# Patient Record
Sex: Female | Born: 1965 | Race: White | Hispanic: No | Marital: Married | Smoking: Former smoker
Health system: Southern US, Community
[De-identification: ages and names within clinical notes are randomized; demographics above are authoritative.]

## PROBLEM LIST (undated history)

## (undated) DIAGNOSIS — I10 Essential (primary) hypertension: Secondary | ICD-10-CM

---

## 1998-11-25 ENCOUNTER — Other Ambulatory Visit: Admission: RE | Admit: 1998-11-25 | Discharge: 1998-11-25 | Payer: Self-pay | Admitting: Obstetrics & Gynecology

## 1999-03-14 ENCOUNTER — Ambulatory Visit (HOSPITAL_COMMUNITY): Admission: RE | Admit: 1999-03-14 | Discharge: 1999-03-14 | Payer: Self-pay | Admitting: Obstetrics & Gynecology

## 2000-04-22 ENCOUNTER — Other Ambulatory Visit: Admission: RE | Admit: 2000-04-22 | Discharge: 2000-04-22 | Payer: Self-pay | Admitting: Obstetrics & Gynecology

## 2001-04-28 ENCOUNTER — Other Ambulatory Visit: Admission: RE | Admit: 2001-04-28 | Discharge: 2001-04-28 | Payer: Self-pay | Admitting: Obstetrics & Gynecology

## 2003-11-05 ENCOUNTER — Other Ambulatory Visit: Admission: RE | Admit: 2003-11-05 | Discharge: 2003-11-05 | Payer: Self-pay | Admitting: Obstetrics & Gynecology

## 2004-12-25 ENCOUNTER — Other Ambulatory Visit: Admission: RE | Admit: 2004-12-25 | Discharge: 2004-12-25 | Payer: Self-pay | Admitting: Gynecology

## 2012-09-14 DIAGNOSIS — R928 Other abnormal and inconclusive findings on diagnostic imaging of breast: Secondary | ICD-10-CM | POA: Insufficient documentation

## 2012-12-09 ENCOUNTER — Other Ambulatory Visit: Payer: Self-pay | Admitting: Obstetrics & Gynecology

## 2012-12-09 DIAGNOSIS — R928 Other abnormal and inconclusive findings on diagnostic imaging of breast: Secondary | ICD-10-CM

## 2012-12-23 ENCOUNTER — Ambulatory Visit
Admission: RE | Admit: 2012-12-23 | Discharge: 2012-12-23 | Disposition: A | Discharge status: Home / Self Care | Payer: Managed Care, Other (non HMO) | Source: Ambulatory Visit | Attending: Obstetrics & Gynecology | Admitting: Obstetrics & Gynecology

## 2012-12-23 DIAGNOSIS — R928 Other abnormal and inconclusive findings on diagnostic imaging of breast: Secondary | ICD-10-CM

## 2013-11-27 ENCOUNTER — Other Ambulatory Visit: Payer: Self-pay | Admitting: Obstetrics & Gynecology

## 2013-11-27 DIAGNOSIS — N6310 Unspecified lump in the right breast, unspecified quadrant: Secondary | ICD-10-CM

## 2013-12-07 ENCOUNTER — Ambulatory Visit
Admission: RE | Admit: 2013-12-07 | Discharge: 2013-12-07 | Disposition: A | Discharge status: Home / Self Care | Payer: BC Managed Care – PPO | Source: Ambulatory Visit | Attending: Obstetrics & Gynecology | Admitting: Obstetrics & Gynecology

## 2013-12-07 DIAGNOSIS — N6310 Unspecified lump in the right breast, unspecified quadrant: Secondary | ICD-10-CM

## 2014-12-19 ENCOUNTER — Other Ambulatory Visit: Payer: Self-pay | Admitting: Obstetrics & Gynecology

## 2014-12-19 DIAGNOSIS — N63 Unspecified lump in unspecified breast: Secondary | ICD-10-CM

## 2014-12-25 ENCOUNTER — Ambulatory Visit
Admission: RE | Admit: 2014-12-25 | Discharge: 2014-12-25 | Disposition: A | Discharge status: Home / Self Care | Payer: BLUE CROSS/BLUE SHIELD | Source: Ambulatory Visit | Attending: Obstetrics & Gynecology | Admitting: Obstetrics & Gynecology

## 2014-12-25 ENCOUNTER — Other Ambulatory Visit: Payer: Self-pay | Admitting: Obstetrics & Gynecology

## 2014-12-25 DIAGNOSIS — N63 Unspecified lump in unspecified breast: Secondary | ICD-10-CM

## 2015-05-13 IMAGING — MG MM DIAG BREAST TOMO BILATERAL
8 series · 8 of 24 positions shown · non-contrast
Comparison: Priors

CLINICAL DATA: Patient for short-term followup probably benign
right breast mass.

EXAM:
DIGITAL DIAGNOSTIC BILATERAL MAMMOGRAM WITH 3D TOMOSYNTHESIS WITH
CAD
ULTRASOUND BILATERAL BREAST

[R CC]
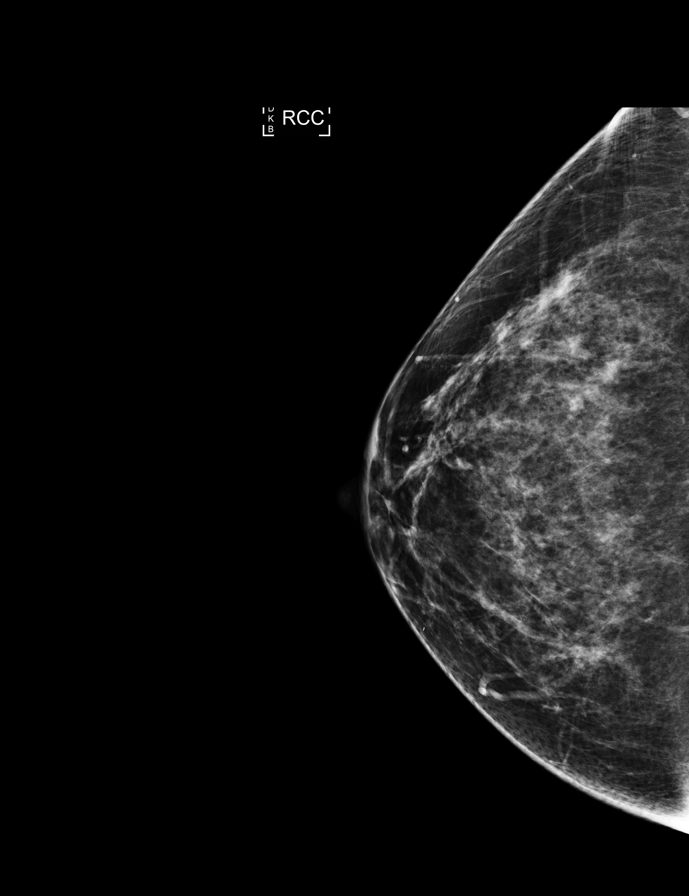

[L CC]
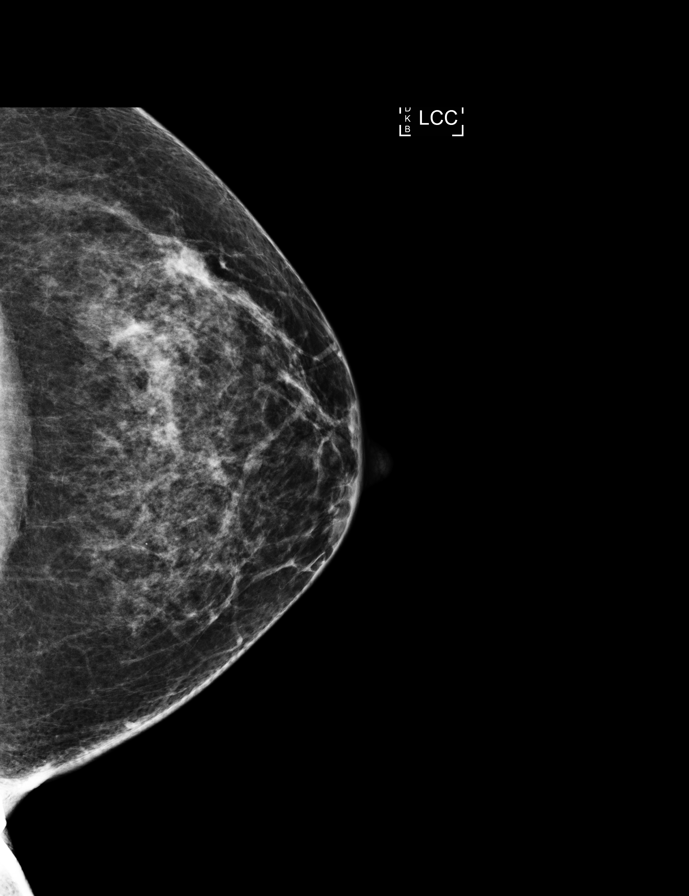

[R MLO]
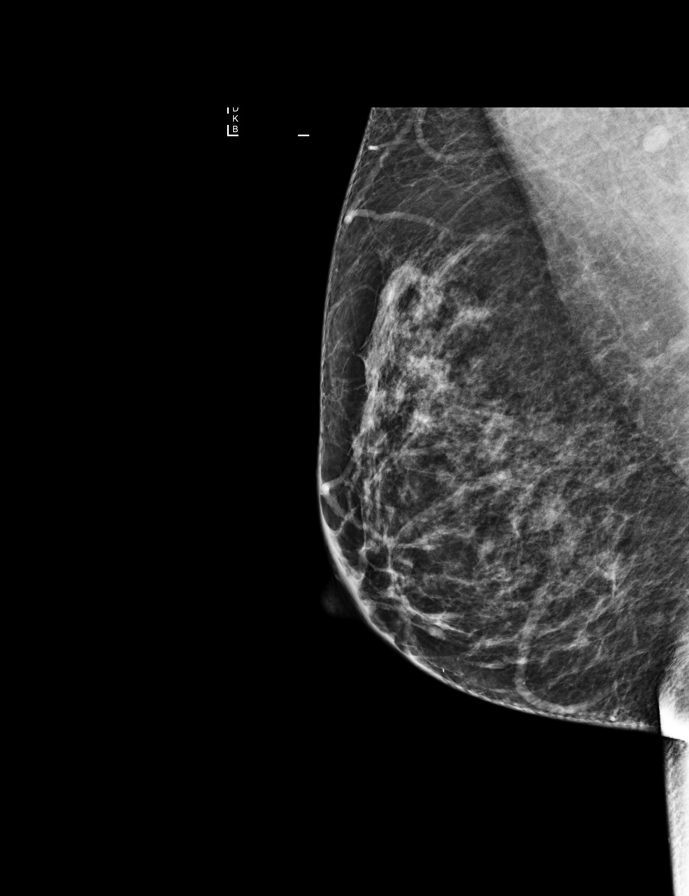

[L MLO]
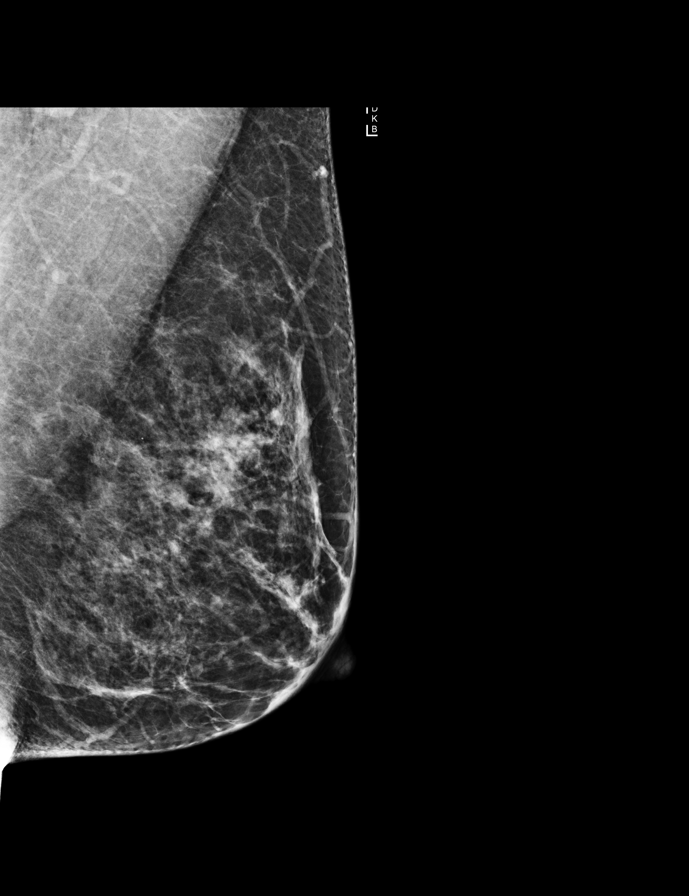

[R MLO tomo · tomo slice 36/71.0]
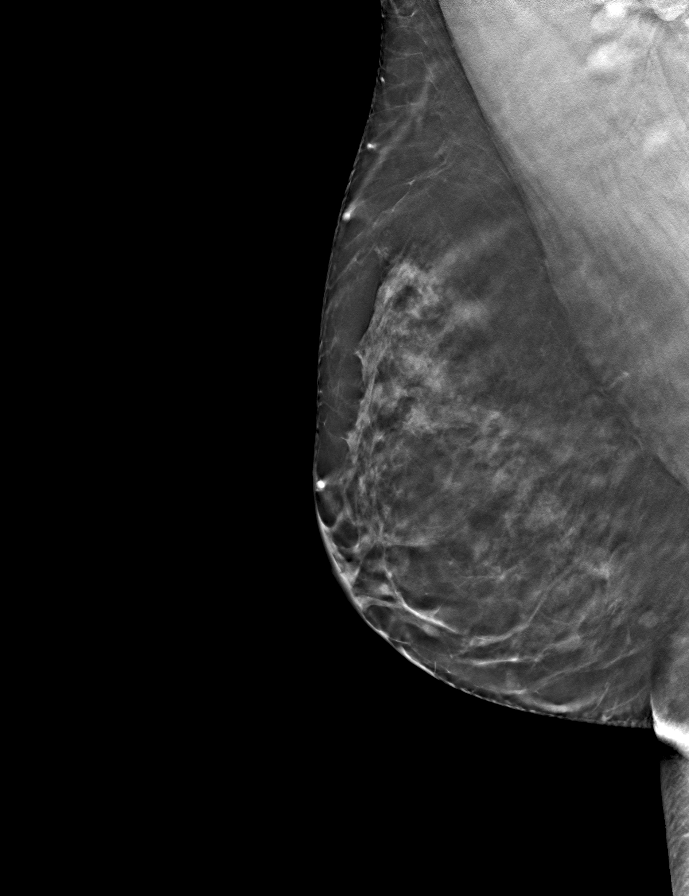

[L MLO tomo · tomo slice 35/70.0]
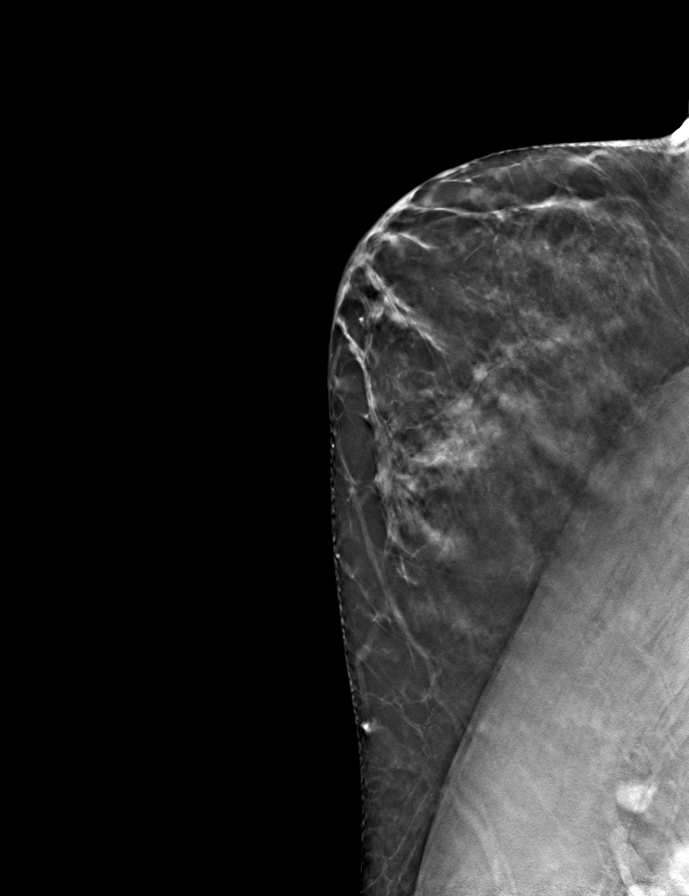

[L CC tomo · tomo slice 33/65.0]
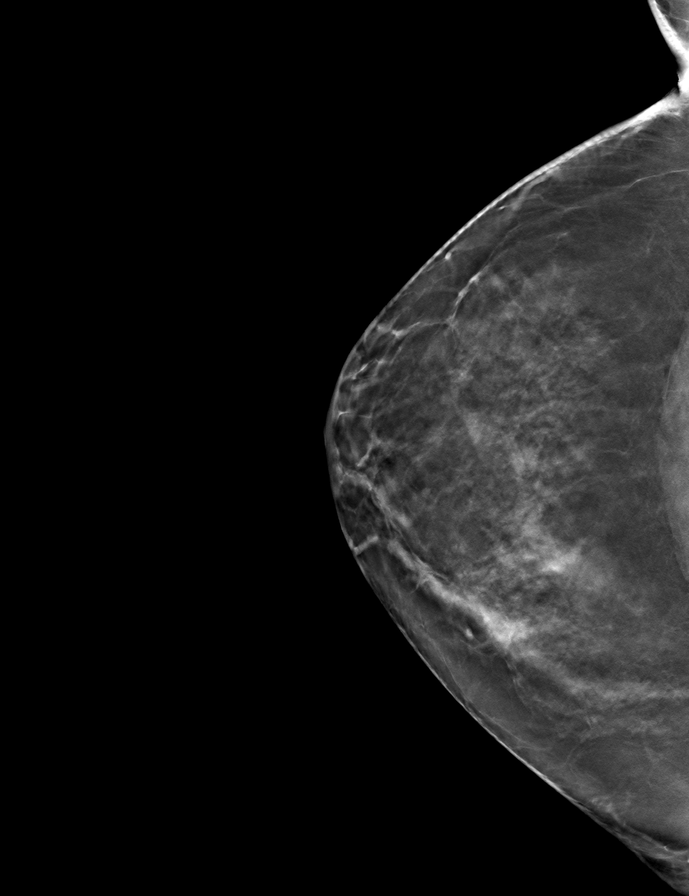

[R CC tomo · tomo slice 35/68.0]
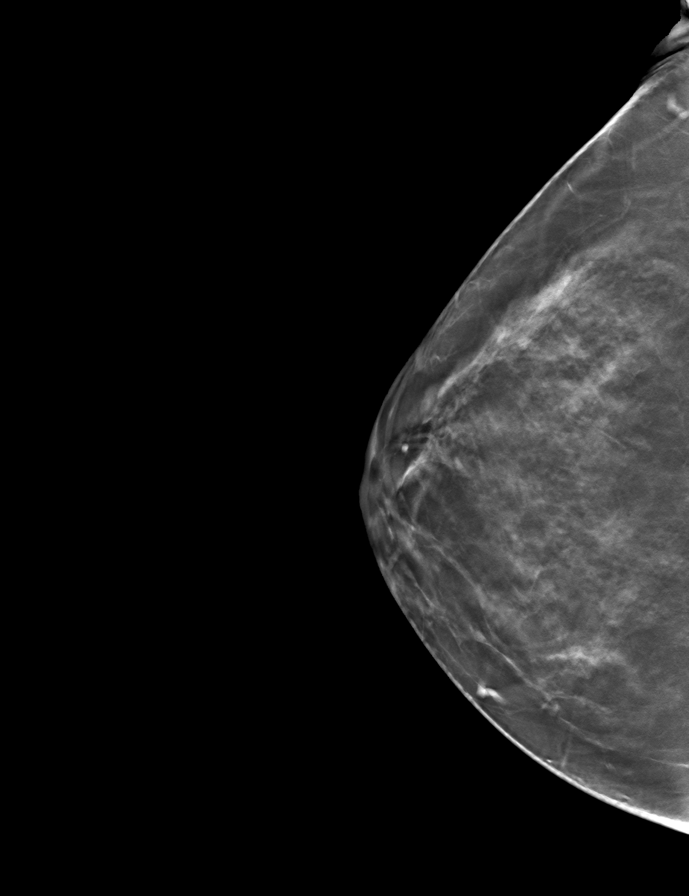

[8 of 24 positions shown; findings below may reference images not displayed]

ACR Breast Density Category c: The breast tissue is heterogeneously
dense, which may obscure small masses.
FINDINGS: Questioned mass within the upper-outer right breast appears to have
resolved on mammography. Within the inferior lateral and inferior
medial there are 2 new oval circumscribed masses measuring 4 mm
within the lower outer left breast and 5 mm within the lower inner
left breast.

Mammographic images were processed with CAD.

On physical exam, I palpate no discrete mass within the lower outer
left breast or lower inner left breast.

Targeted ultrasound is performed, showing a 3 x 3 x 4 mm probable
complicated cyst within the left breast 6 o'clock position 1 cm from
the nipple. Additionally within the left breast 4 o'clock position 3
cm from the nipple there is a 5 x 3 x 5 mm probable complicated
cyst.
IMPRESSION: New oval circumscribed masses within the left breast favored to
represent complicated cysts on ultrasound.

Interval resolution right breast mass, most compatible with resolved
cyst.

RECOMMENDATION:
Left breast diagnostic mammography and ultrasound in 6 months to
demonstrate stability of probably benign findings.

I have discussed the findings and recommendations with the patient.
Results were also provided in writing at the conclusion of the
visit. If applicable, a reminder letter will be sent to the patient
regarding the next appointment.

BI-RADS CATEGORY  3: Probably benign.

## 2015-09-12 DIAGNOSIS — E039 Hypothyroidism, unspecified: Secondary | ICD-10-CM | POA: Insufficient documentation

## 2015-09-12 DIAGNOSIS — F419 Anxiety disorder, unspecified: Secondary | ICD-10-CM | POA: Insufficient documentation

## 2016-03-10 ENCOUNTER — Other Ambulatory Visit: Payer: Self-pay | Admitting: Obstetrics & Gynecology

## 2016-03-10 DIAGNOSIS — N6320 Unspecified lump in the left breast, unspecified quadrant: Secondary | ICD-10-CM

## 2016-03-16 ENCOUNTER — Ambulatory Visit
Admission: RE | Admit: 2016-03-16 | Discharge: 2016-03-16 | Disposition: A | Discharge status: Home / Self Care | Payer: Managed Care, Other (non HMO) | Source: Ambulatory Visit | Attending: Obstetrics & Gynecology | Admitting: Obstetrics & Gynecology

## 2016-03-16 DIAGNOSIS — N6320 Unspecified lump in the left breast, unspecified quadrant: Secondary | ICD-10-CM

## 2016-12-07 DIAGNOSIS — M7070 Other bursitis of hip, unspecified hip: Secondary | ICD-10-CM | POA: Insufficient documentation

## 2017-10-29 DIAGNOSIS — M2240 Chondromalacia patellae, unspecified knee: Secondary | ICD-10-CM | POA: Insufficient documentation

## 2017-10-29 DIAGNOSIS — N3281 Overactive bladder: Secondary | ICD-10-CM | POA: Insufficient documentation

## 2017-10-29 DIAGNOSIS — N912 Amenorrhea, unspecified: Secondary | ICD-10-CM | POA: Insufficient documentation

## 2017-10-29 DIAGNOSIS — IMO0002 Reserved for concepts with insufficient information to code with codable children: Secondary | ICD-10-CM | POA: Insufficient documentation

## 2017-10-29 DIAGNOSIS — N926 Irregular menstruation, unspecified: Secondary | ICD-10-CM | POA: Insufficient documentation

## 2019-02-22 DIAGNOSIS — M5412 Radiculopathy, cervical region: Secondary | ICD-10-CM | POA: Insufficient documentation

## 2019-02-22 DIAGNOSIS — G5600 Carpal tunnel syndrome, unspecified upper limb: Secondary | ICD-10-CM | POA: Insufficient documentation

## 2019-02-27 DIAGNOSIS — F988 Other specified behavioral and emotional disorders with onset usually occurring in childhood and adolescence: Secondary | ICD-10-CM | POA: Insufficient documentation

## 2019-02-27 DIAGNOSIS — M25559 Pain in unspecified hip: Secondary | ICD-10-CM | POA: Insufficient documentation

## 2019-02-27 DIAGNOSIS — I10 Essential (primary) hypertension: Secondary | ICD-10-CM | POA: Insufficient documentation

## 2019-06-26 DIAGNOSIS — M5090 Cervical disc disorder, unspecified, unspecified cervical region: Secondary | ICD-10-CM | POA: Insufficient documentation

## 2019-11-20 ENCOUNTER — Other Ambulatory Visit: Payer: Self-pay | Admitting: Family Medicine

## 2019-11-20 DIAGNOSIS — R200 Anesthesia of skin: Secondary | ICD-10-CM

## 2019-11-20 DIAGNOSIS — M50122 Cervical disc disorder at C5-C6 level with radiculopathy: Secondary | ICD-10-CM

## 2019-12-06 ENCOUNTER — Other Ambulatory Visit: Payer: Managed Care, Other (non HMO)

## 2020-12-17 ENCOUNTER — Ambulatory Visit (INDEPENDENT_AMBULATORY_CARE_PROVIDER_SITE_OTHER): Payer: PRIVATE HEALTH INSURANCE | Admitting: Podiatry

## 2020-12-17 ENCOUNTER — Other Ambulatory Visit: Payer: Self-pay

## 2020-12-17 ENCOUNTER — Encounter: Payer: Self-pay | Admitting: Podiatry

## 2020-12-17 DIAGNOSIS — B351 Tinea unguium: Secondary | ICD-10-CM

## 2020-12-17 DIAGNOSIS — Z79899 Other long term (current) drug therapy: Secondary | ICD-10-CM

## 2020-12-17 NOTE — Progress Notes (Signed)
Subjective:  Patient ID: Sandra Wall, female    DOB: [birth year],  MRN: [id number]  Chief Complaint  Patient presents with  . Nail Problem    Patient presents today for nail fungus, thick curving nails x 1-2 months.      55 y.o. female presents with the above complaint.  Patient presents with complaint of thickened elongated dystrophic mycotic toenails x4 to bilateral first and second digit.  Patient states that she has not tried any over-the-counter options.  She states that she has tried Vicks VapoRub some fungal meds but none of which has helped.  She would like to get it evaluated and would like to discuss treatment for fungus.  She denies any other acute complaints she has not seen anyone else prior to seeing me.   Review of Systems: Negative except as noted in the HPI. Denies N/V/F/Ch.  No past medical history on file.  Current Outpatient Medications:  .  ALPRAZolam (XANAX) 0.25 MG tablet, alprazolam 0.25 mg tablet  TAKE 1 TABLET BY MOUTH THREE TIMES DAILY AS NEEDED FOR ANXIETY, Disp: , Rfl:  .  amphetamine-dextroamphetamine (ADDERALL) 20 MG tablet, Take 1 tablet by mouth daily., Disp: , Rfl:  .  atorvastatin (LIPITOR) 20 MG tablet, Take 1 tablet by mouth daily., Disp: , Rfl:  .  levothyroxine (SYNTHROID) 75 MCG tablet, levothyroxine 75 mcg tablet  TAKE 1 TABLET BY MOUTH ONCE DAILY IN THE MORNING AT 6AM, Disp: , Rfl:  .  meloxicam (MOBIC) 15 MG tablet, meloxicam 15 mg tablet, Disp: , Rfl:  .  perindopril (ACEON) 4 MG tablet, perindopril erbumine 4 mg tablet  TAKE 1 TABLET BY MOUTH ONCE DAILY, Disp: , Rfl:  .  [START ON 02/08/2021] amphetamine-dextroamphetamine (ADDERALL) 30 MG tablet, Take 1 tablet by mouth every 8 (eight) hours as needed., Disp: , Rfl:  .  escitalopram (LEXAPRO) 20 MG tablet, Take 1 tablet by mouth daily., Disp: , Rfl:  .  fluticasone (FLONASE) 50 MCG/ACT nasal spray, fluticasone propionate 50 mcg/actuation nasal spray,suspension, Disp: , Rfl:   Social History    Tobacco Use  Smoking Status Former Smoker  Smokeless Tobacco Never Used    Allergies  Allergen Reactions  . Morphine Rash   Objective:  There were no vitals filed for this visit. There is no height or weight on file to calculate BMI. Constitutional Well developed. Well nourished.  Vascular Dorsalis pedis pulses palpable bilaterally. Posterior tibial pulses palpable bilaterally. Capillary refill normal to all digits.  No cyanosis or clubbing noted. Pedal hair growth normal.  Neurologic Normal speech. Oriented to person, place, and time. Epicritic sensation to light touch grossly present bilaterally.  Dermatologic Nails thickened elongated dystrophic toenails x4.  This is to bilateral first and second digit.  No pain on palpation Skin within normal limits bilateral  Orthopedic: Normal joint ROM without pain or crepitus bilaterally. No visible deformities. No bony tenderness.   Radiographs: None Assessment:   1. Encounter for long-term current use of medication    Plan:  Patient was evaluated and treated and all questions answered.  Bilateral hallux and second digit onychomycosis/nail fungus -Educated the patient on the etiology of onychomycosis and various treatment options associated with improving the fungal load.  I explained to the patient that there is 3 treatment options available to treat the onychomycosis including topical, p.o., laser treatment.  Patient elected to undergo p.o. options with Lamisil/terbinafine therapy.  In order for me to start the medication therapy, I explained to the patient the importance  of evaluating the liver and obtaining the liver function test.  Once the liver function test comes back normal I will start him on 10-month course of Lamisil therapy.  Patient understood all risk and would like to proceed with Lamisil therapy.  I have asked the patient to immediately stop the Lamisil therapy if she has any reactions to it and call the office or go  to the emergency room right away.  Patient states understanding   No follow-ups on file.

## 2020-12-18 ENCOUNTER — Other Ambulatory Visit: Payer: Self-pay | Admitting: Podiatry

## 2020-12-18 LAB — HEPATIC FUNCTION PANEL
ALT: 15 IU/L (ref 0–32)
AST: 12 IU/L (ref 0–40)
Albumin: 4.7 g/dL (ref 3.8–4.9)
Alkaline Phosphatase: 99 IU/L (ref 44–121)
Bilirubin Total: 0.2 mg/dL (ref 0.0–1.2)
Bilirubin, Direct: <0.1 mg/dL (ref 0.00–0.40)
Total Protein: 7 g/dL (ref 6.0–8.5)

## 2020-12-18 MED ORDER — TERBINAFINE HCL 250 MG PO TABS: 250.0000 mg | ORAL_TABLET | Freq: Every day | ORAL | 0 refills | Status: AC

## 2021-03-20 ENCOUNTER — Other Ambulatory Visit: Payer: Self-pay

## 2021-03-20 ENCOUNTER — Encounter: Payer: Self-pay | Admitting: Podiatry

## 2021-03-20 ENCOUNTER — Ambulatory Visit (INDEPENDENT_AMBULATORY_CARE_PROVIDER_SITE_OTHER): Payer: PRIVATE HEALTH INSURANCE | Admitting: Podiatry

## 2021-03-20 DIAGNOSIS — Z79899 Other long term (current) drug therapy: Secondary | ICD-10-CM

## 2021-03-20 DIAGNOSIS — B351 Tinea unguium: Secondary | ICD-10-CM | POA: Diagnosis not present

## 2021-03-20 NOTE — Progress Notes (Signed)
  Subjective:  Patient ID: Sandra Wall, female    DOB: [birth year],  MRN: [id number]  Chief Complaint  Patient presents with   Nail Problem    Nail fungus follow up     55 y.o. female presents with the above complaint.  Patient presents with follow-up of thickened elongated dystrophic toenails x4.  Patient states she is doing a lot better the Lamisil therapy helped considerably.  She would like to discuss future treatment options.  She does not want to do another round if not needed..   Review of Systems: Negative except as noted in the HPI. Denies N/V/F/Ch.  No past medical history on file.  Current Outpatient Medications:    ALPRAZolam (XANAX) 0.25 MG tablet, alprazolam 0.25 mg tablet  TAKE 1 TABLET BY MOUTH THREE TIMES DAILY AS NEEDED FOR ANXIETY, Disp: , Rfl:    amphetamine-dextroamphetamine (ADDERALL) 20 MG tablet, Take 1 tablet by mouth daily., Disp: , Rfl:    amphetamine-dextroamphetamine (ADDERALL) 30 MG tablet, Take 1 tablet by mouth every 8 (eight) hours as needed., Disp: , Rfl:    atorvastatin (LIPITOR) 20 MG tablet, Take 1 tablet by mouth daily., Disp: , Rfl:    escitalopram (LEXAPRO) 20 MG tablet, Take 1 tablet by mouth daily., Disp: , Rfl:    fluticasone (FLONASE) 50 MCG/ACT nasal spray, fluticasone propionate 50 mcg/actuation nasal spray,suspension, Disp: , Rfl:    levothyroxine (SYNTHROID) 75 MCG tablet, levothyroxine 75 mcg tablet  TAKE 1 TABLET BY MOUTH ONCE DAILY IN THE MORNING AT 6AM, Disp: , Rfl:    meloxicam (MOBIC) 15 MG tablet, meloxicam 15 mg tablet, Disp: , Rfl:    perindopril (ACEON) 4 MG tablet, perindopril erbumine 4 mg tablet  TAKE 1 TABLET BY MOUTH ONCE DAILY, Disp: , Rfl:    terbinafine (LAMISIL) 250 MG tablet, Take 1 tablet (250 mg total) by mouth daily., Disp: 90 tablet, Rfl: 0  Social History   Tobacco Use  Smoking Status Former   Pack years: 0.00  Smokeless Tobacco Never    Allergies  Allergen Reactions   Morphine Rash   Objective:  There  were no vitals filed for this visit. There is no height or weight on file to calculate BMI. Constitutional Well developed. Well nourished.  Vascular Dorsalis pedis pulses palpable bilaterally. Posterior tibial pulses palpable bilaterally. Capillary refill normal to all digits.  No cyanosis or clubbing noted. Pedal hair growth normal.  Neurologic Normal speech. Oriented to person, place, and time. Epicritic sensation to light touch grossly present bilaterally.  Dermatologic Nails thickened elongated dystrophic toenails x4 improving.  This is to bilateral first and second digit improving.  No pain on palpation Skin within normal limits bilateral  Orthopedic: Normal joint ROM without pain or crepitus bilaterally. No visible deformities. No bony tenderness.   Radiographs: None Assessment:   1. Encounter for long-term current use of medication   2. Onychomycosis due to dermatophyte   3. Nail fungus     Plan:  Patient was evaluated and treated and all questions answered.  Bilateral hallux and second digit onychomycosis/nail fungus -Clinically healed with 1 round of Lamisil therapy.  At this time we will continue to clinically monitor if there is any regression I discussed with possible doing second round of Lamisil therapy.  Patient states understanding.  I discussed shoe gear modification and prevention techniques for fungus.  No acute complaints   No follow-ups on file.

## 2021-09-16 ENCOUNTER — Encounter: Payer: PRIVATE HEALTH INSURANCE | Admitting: Podiatry

## 2022-02-21 ENCOUNTER — Emergency Department
Admission: EM | Admit: 2022-02-21 | Discharge: 2022-02-21 | Disposition: A | Discharge status: Home / Self Care | Payer: Managed Care, Other (non HMO) | Attending: Emergency Medicine | Admitting: Emergency Medicine

## 2022-02-21 ENCOUNTER — Emergency Department: Payer: Managed Care, Other (non HMO)

## 2022-02-21 ENCOUNTER — Encounter: Payer: Self-pay | Admitting: Emergency Medicine

## 2022-02-21 ENCOUNTER — Other Ambulatory Visit: Payer: Self-pay

## 2022-02-21 DIAGNOSIS — S6992XA Unspecified injury of left wrist, hand and finger(s), initial encounter: Secondary | ICD-10-CM | POA: Diagnosis present

## 2022-02-21 DIAGNOSIS — W268XXA Contact with other sharp object(s), not elsewhere classified, initial encounter: Secondary | ICD-10-CM | POA: Insufficient documentation

## 2022-02-21 DIAGNOSIS — S61319A Laceration without foreign body of unspecified finger with damage to nail, initial encounter: Secondary | ICD-10-CM

## 2022-02-21 DIAGNOSIS — S61313A Laceration without foreign body of left middle finger with damage to nail, initial encounter: Secondary | ICD-10-CM | POA: Diagnosis not present

## 2022-02-21 DIAGNOSIS — S61213A Laceration without foreign body of left middle finger without damage to nail, initial encounter: Secondary | ICD-10-CM

## 2022-02-21 HISTORY — DX: Essential (primary) hypertension: I10

## 2022-02-21 MED ORDER — BUPIVACAINE HCL (PF) 0.5 % IJ SOLN
10.0000 mL | Freq: Once | INTRAMUSCULAR | Status: AC
  Administered 2022-02-21: 10 mL
  Filled 2022-02-21: qty 10

## 2022-02-21 MED ORDER — PENTAFLUOROPROP-TETRAFLUOROETH EX AERO
INHALATION_SPRAY | CUTANEOUS | Status: DC | PRN
  Filled 2022-02-21: qty 30

## 2022-02-21 MED ORDER — LIDOCAINE HCL (PF) 1 % IJ SOLN
5.0000 mL | Freq: Once | INTRAMUSCULAR | Status: AC
  Administered 2022-02-21: 5 mL
  Filled 2022-02-21 (×2): qty 5

## 2022-02-21 NOTE — ED Provider Notes (Signed)
[facility] Emergency Department Provider Note     Event Date/Time   First MD Initiated Contact with Patient 02/21/22 1502     (approximate)   History   Laceration   HPI  Sandra Wall is a 56 y.o. female presents to the ED for evaluation of accidental left hand middle finger laceration.  Patient apparently rotary blade.  She presents with a distal fingertip near avulsion.  She reports a current tetanus status.     Physical Exam   Triage Vital Signs: ED Triage Vitals  Enc Vitals Group     BP 02/21/22 1345 103/70     Pulse Rate 02/21/22 1345 84     Resp 02/21/22 1345 18     Temp 02/21/22 1345 98.9 F (37.2 C)     Temp Source 02/21/22 1345 Oral     SpO2 02/21/22 1345 95 %     Weight 02/21/22 1339 190 lb (86.2 kg)     Height 02/21/22 1339 5\' 5"  (1.651 m)     Head Circumference --      Peak Flow --      Pain Score 02/21/22 1332 2     Pain Loc --      Pain Edu? --      Excl. in GC? --     Most recent vital signs: Vitals:   02/21/22 1345  BP: 103/70  Pulse: 84  Resp: 18  Temp: 98.9 F (37.2 C)  SpO2: 95%    General Awake, no distress.  CV:  Good peripheral perfusion.  RESP:  Normal effort.  ABD:  No distention.  MSK:  Left middle finger with a distal nailbed laceration with extension around the radial aspect of the distal fingertip resulting in a partial nail tip avulsion.  Normal gross sensation is intact.  ED Results / Procedures / Treatments   Labs (all labs ordered are listed, but only abnormal results are displayed) Labs Reviewed - No data to display   EKG    RADIOLOGY  I personally viewed and evaluated these images as part of my medical decision making, as well as reviewing the written report by the radiologist.  ED Provider Interpretation: no acute bony injury}  DG Finger Middle Left  Result Date: 02/21/2022 CLINICAL DATA:  Laceration. EXAM: LEFT MIDDLE FINGER 2+V COMPARISON:  None Available. FINDINGS: No  evidence for an acute fracture. No subluxation or dislocation. No retained radiopaque soft tissue foreign body. IMPRESSION: No acute bony abnormality. Electronically Signed   By: 04/23/2022 M.D.   On: 02/21/2022 14:04     PROCEDURES:  Critical Care performed: No  ..Laceration Repair  Date/Time: 02/21/2022 4:27 PM  Performed by: 04/23/2022, PA-C Authorized by: Lissa Hoard, PA-C   Consent:    Consent obtained:  Verbal   Consent given by:  Patient   Risks, benefits, and alternatives were discussed: yes     Risks discussed:  Poor wound healing Universal protocol:    Imaging studies available: yes     Patient identity confirmed:  Verbally with patient Anesthesia:    Anesthesia method:  Nerve block   Block location:  Flexor tendon   Block needle gauge:  27 G   Block anesthetic:  Lidocaine 1% w/o epi and bupivacaine 0.5% w/o epi   Block technique:  Transthecal block   Block injection procedure:  Anatomic landmarks palpated, introduced needle and incremental injection   Block outcome:  Anesthesia achieved Laceration details:  Location:  Finger   Finger location:  L long finger   Length (cm):  1   Depth (mm):  5 Pre-procedure details:    Preparation:  Patient was prepped and draped in usual sterile fashion Exploration:    Limited defect created (wound extended): no     Hemostasis achieved with:  Direct pressure   Contaminated: no   Treatment:    Area cleansed with:  Saline and povidone-iodine   Amount of cleaning:  Standard   Irrigation solution:  Sterile saline   Irrigation volume:  10   Irrigation method:  Syringe   Debridement:  None   Undermining:  None   Scar revision: no     Layers repaired: nail bed: #2 5-0 vicryl interrupted. Skin repair:    Repair method:  Sutures   Suture size:  4-0   Suture material:  Nylon   Suture technique:  Simple interrupted   Number of sutures:  3 Approximation:    Approximation:  Close Repair type:     Repair type:  Simple Post-procedure details:    Dressing:  Splint for protection and non-adherent dressing   Procedure completion:  Tolerated well, no immediate complications    MEDICATIONS ORDERED IN ED: Medications  pentafluoroprop-tetrafluoroeth (GEBAUERS) aerosol (has no administration in time range)  lidocaine (PF) (XYLOCAINE) 1 % injection 5 mL (5 mLs Infiltration Given by Other 02/21/22 1530)  bupivacaine(PF) (MARCAINE) 0.5 % injection 10 mL (10 mLs Infiltration Given by Other 02/21/22 1530)     IMPRESSION / MDM / ASSESSMENT AND PLAN / ED COURSE  I reviewed the triage vital signs and the nursing notes.                              Differential diagnosis includes, but is not limited to, fingertip laceration, fingertip avulsion, nail avulsion, nailbed injury  Patient's presentation is most consistent with acute complicated illness / injury requiring diagnostic workup.  Patient's diagnosis is consistent with partial distal nail avulsion fingertip laceration. Patient will be discharged home with wound care instructions. Patient is to follow up with her primary provider for suture removal as needed or otherwise directed. Patient is given ED precautions to return to the ED for any worsening or new symptoms.     FINAL CLINICAL IMPRESSION(S) / ED DIAGNOSES   Final diagnoses:  Laceration of left middle finger without foreign body without damage to nail, initial encounter  Laceration of nail bed of finger, initial encounter     Rx / DC Orders   ED Discharge Orders     None        Note:  This document was prepared using Dragon voice recognition software and may include unintentional dictation errors.    Lissa Hoard, PA-C 02/21/22 1651    Sharman Cheek, MD 02/22/22 1927

## 2022-02-21 NOTE — Discharge Instructions (Signed)
Keep the wound clean, dry, and covered. See your  provider for suture removal in 7-10 days. ?

## 2022-02-21 NOTE — ED Triage Notes (Signed)
Pt reports cut her left hand middle finger on a rotary fabric blade. Pt reports she had just changed the blade. Pt tip of left hand middle finger folded over to the side. Bleeding controlled at this time. Last tetanus a couple of years ago per pt.

## 2022-07-10 IMAGING — DX DG FINGER MIDDLE 2+V*L*
3 series · 3 of 3 positions shown · non-contrast
Comparison: None Available.

CLINICAL DATA: Laceration.

EXAM:
LEFT MIDDLE FINGER 2+V

[finger ap]
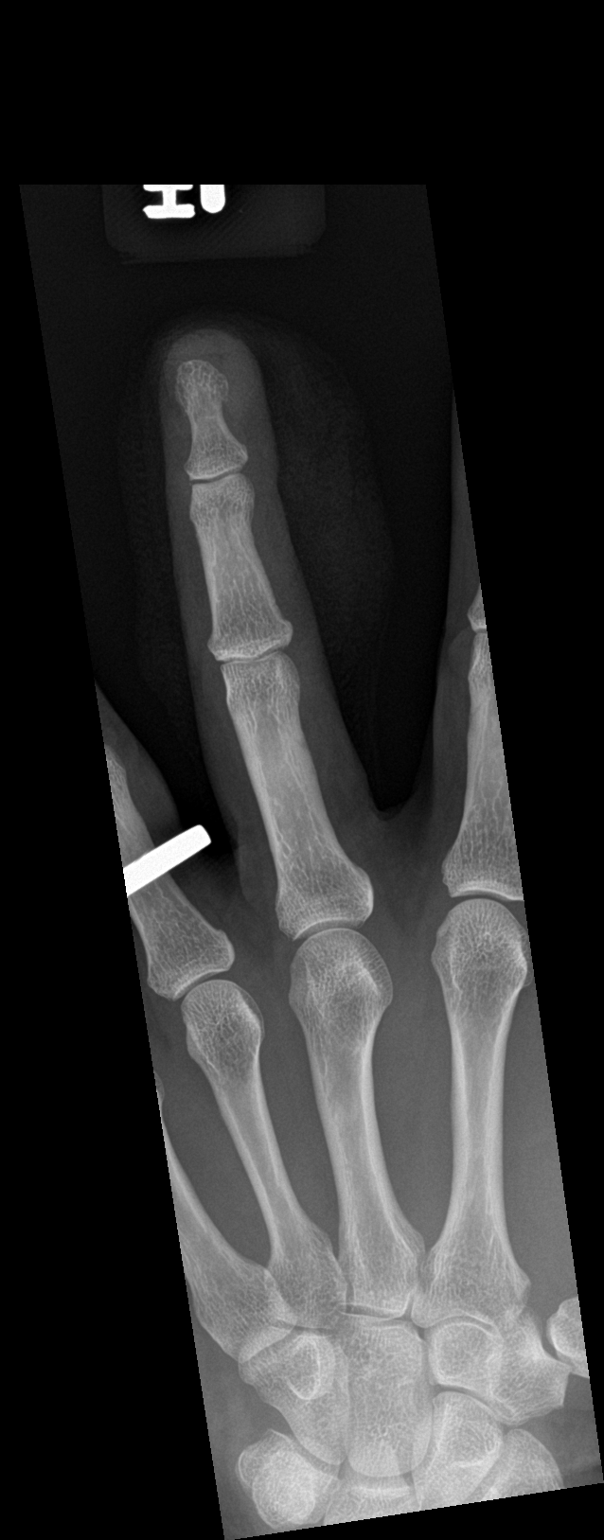

[finger obl]
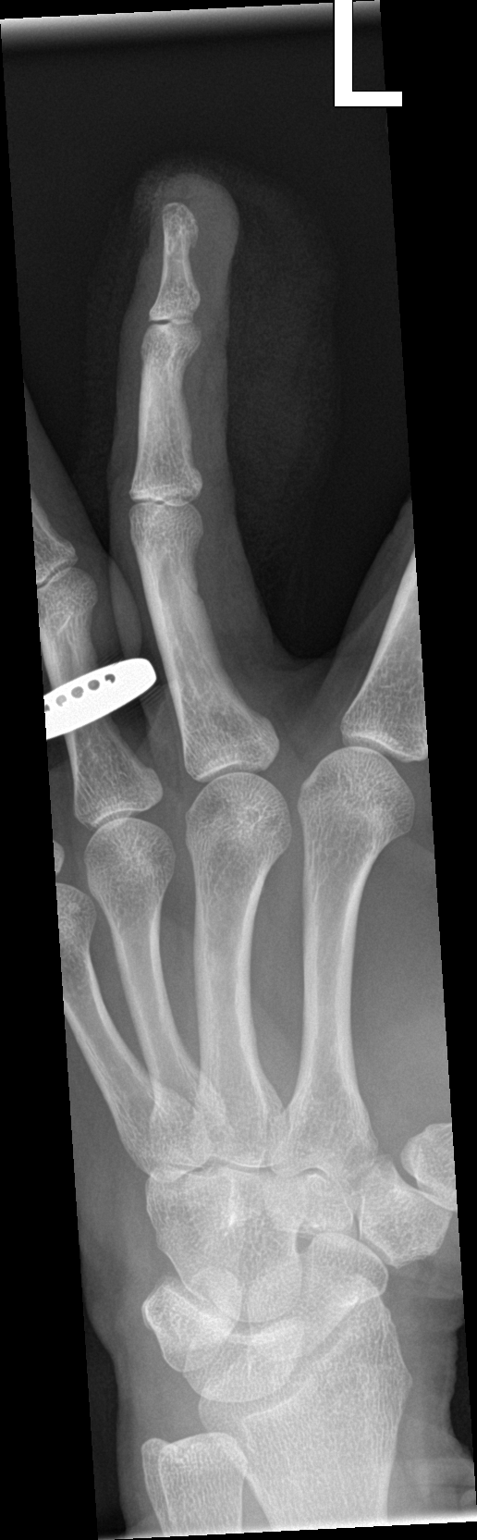

[finger lat]
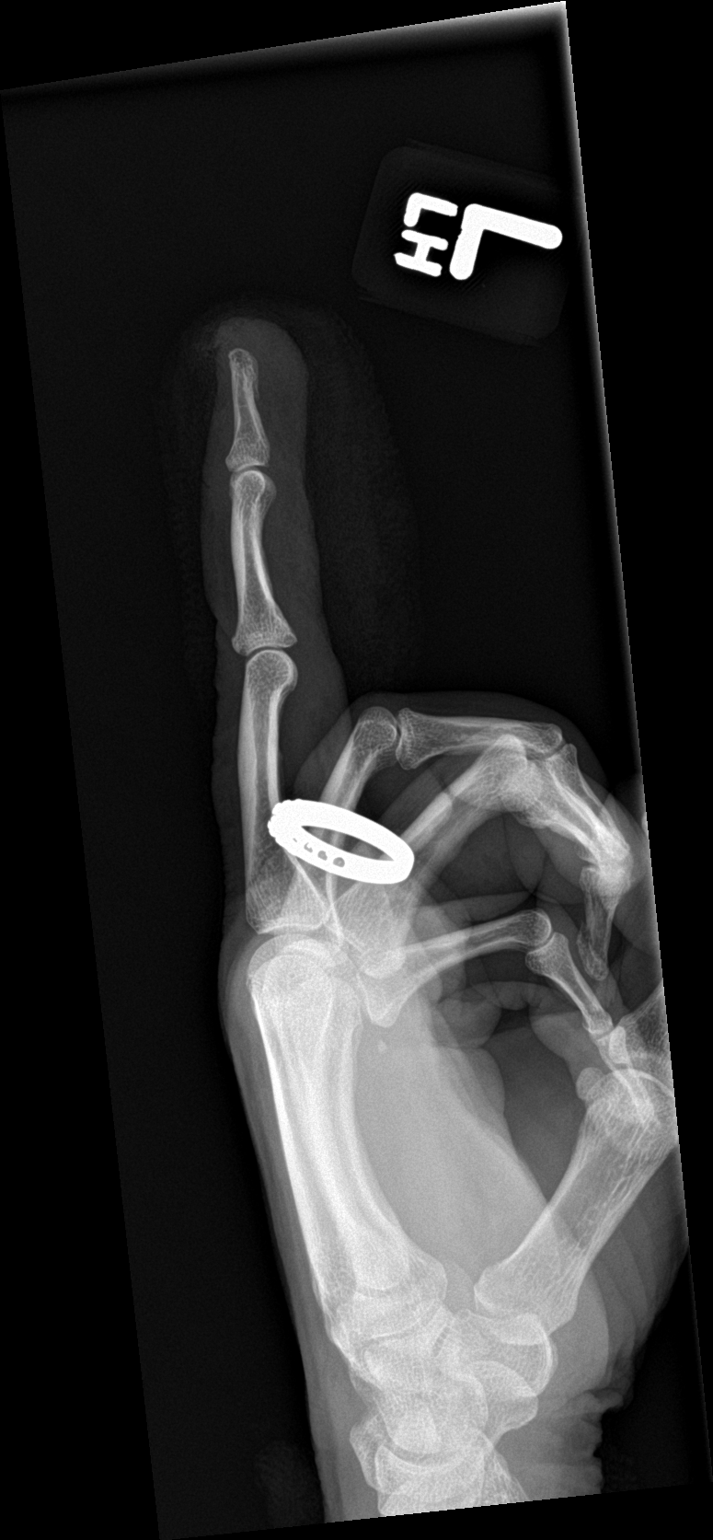

[3 of 3 positions shown; findings below may reference images not displayed]

FINDINGS: No evidence for an acute fracture. No subluxation or dislocation. No
retained radiopaque soft tissue foreign body.
IMPRESSION: No acute bony abnormality.

## 2023-06-07 ENCOUNTER — Other Ambulatory Visit: Payer: Self-pay | Admitting: Family Medicine

## 2023-06-07 DIAGNOSIS — M5416 Radiculopathy, lumbar region: Secondary | ICD-10-CM

## 2023-06-07 DIAGNOSIS — M5136 Other intervertebral disc degeneration, lumbar region: Secondary | ICD-10-CM

## 2023-06-08 ENCOUNTER — Ambulatory Visit
Admission: RE | Admit: 2023-06-08 | Discharge: 2023-06-08 | Disposition: A | Discharge status: Home / Self Care | Payer: Managed Care, Other (non HMO) | Source: Ambulatory Visit | Attending: Family Medicine | Admitting: Family Medicine

## 2023-06-08 DIAGNOSIS — M5416 Radiculopathy, lumbar region: Secondary | ICD-10-CM

## 2023-06-08 DIAGNOSIS — M5136 Other intervertebral disc degeneration, lumbar region: Secondary | ICD-10-CM

## 2024-06-02 ENCOUNTER — Other Ambulatory Visit: Payer: Self-pay | Admitting: Obstetrics & Gynecology

## 2024-06-02 DIAGNOSIS — R928 Other abnormal and inconclusive findings on diagnostic imaging of breast: Secondary | ICD-10-CM

## 2024-06-06 ENCOUNTER — Encounter

## 2024-06-06 ENCOUNTER — Other Ambulatory Visit

## 2024-06-15 ENCOUNTER — Ambulatory Visit

## 2024-06-19 ENCOUNTER — Ambulatory Visit
Admission: RE | Admit: 2024-06-19 | Discharge: 2024-06-19 | Disposition: A | Discharge status: Home / Self Care | Source: Ambulatory Visit | Attending: Obstetrics & Gynecology | Admitting: Obstetrics & Gynecology

## 2024-06-19 ENCOUNTER — Ambulatory Visit
Admission: RE | Admit: 2024-06-19 | Discharge: 2024-06-19 | Disposition: A | Source: Ambulatory Visit | Attending: Obstetrics & Gynecology | Admitting: Obstetrics & Gynecology

## 2024-06-19 DIAGNOSIS — R928 Other abnormal and inconclusive findings on diagnostic imaging of breast: Secondary | ICD-10-CM

## 2024-07-26 ENCOUNTER — Other Ambulatory Visit: Payer: Self-pay

## 2024-07-26 DIAGNOSIS — Z87891 Personal history of nicotine dependence: Secondary | ICD-10-CM

## 2024-07-26 DIAGNOSIS — Z122 Encounter for screening for malignant neoplasm of respiratory organs: Secondary | ICD-10-CM

## 2024-08-02 ENCOUNTER — Ambulatory Visit

## 2024-08-07 ENCOUNTER — Ambulatory Visit: Payer: PRIVATE HEALTH INSURANCE | Admitting: Podiatry
